# Patient Record
Sex: Male | Born: 1980 | Race: White | Hispanic: No | Marital: Married
Health system: Southern US, Community
[De-identification: ages and names within clinical notes are randomized; demographics above are authoritative.]

## PROBLEM LIST (undated history)

## (undated) DIAGNOSIS — J45909 Unspecified asthma, uncomplicated: Secondary | ICD-10-CM

---
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
## 2002-08-19 ENCOUNTER — Encounter: Payer: Self-pay | Admitting: Emergency Medicine

## 2002-08-19 ENCOUNTER — Emergency Department (HOSPITAL_COMMUNITY): Admission: EM | Admit: 2002-08-19 | Discharge: 2002-08-19 | Payer: Self-pay | Admitting: Emergency Medicine

## 2005-07-09 ENCOUNTER — Emergency Department (HOSPITAL_COMMUNITY): Admission: EM | Admit: 2005-07-09 | Discharge: 2005-07-09 | Payer: Self-pay | Admitting: Emergency Medicine

## 2005-08-09 ENCOUNTER — Emergency Department (HOSPITAL_COMMUNITY): Admission: EM | Admit: 2005-08-09 | Discharge: 2005-08-09 | Payer: Self-pay | Admitting: Family Medicine

## 2015-11-20 ENCOUNTER — Other Ambulatory Visit (HOSPITAL_COMMUNITY): Payer: Self-pay | Admitting: Physician Assistant

## 2015-11-20 DIAGNOSIS — R1011 Right upper quadrant pain: Secondary | ICD-10-CM

## 2015-11-20 DIAGNOSIS — R197 Diarrhea, unspecified: Secondary | ICD-10-CM

## 2015-11-27 ENCOUNTER — Encounter (HOSPITAL_COMMUNITY)
Admission: RE | Admit: 2015-11-27 | Discharge: 2015-11-27 | Disposition: A | Discharge status: Home / Self Care | Payer: Managed Care, Other (non HMO) | Source: Ambulatory Visit | Attending: Physician Assistant | Admitting: Physician Assistant

## 2015-11-27 DIAGNOSIS — R1011 Right upper quadrant pain: Secondary | ICD-10-CM | POA: Insufficient documentation

## 2015-11-27 DIAGNOSIS — R197 Diarrhea, unspecified: Secondary | ICD-10-CM | POA: Insufficient documentation

## 2015-11-27 MED ORDER — TECHNETIUM TC 99M MEBROFENIN IV KIT
5.0000 | PACK | Freq: Once | INTRAVENOUS | Status: AC | PRN
  Administered 2015-11-27: 5 via INTRAVENOUS

## 2015-12-02 ENCOUNTER — Encounter (HOSPITAL_COMMUNITY): Payer: Self-pay

## 2016-09-06 ENCOUNTER — Encounter (HOSPITAL_BASED_OUTPATIENT_CLINIC_OR_DEPARTMENT_OTHER): Payer: Self-pay | Admitting: Emergency Medicine

## 2016-09-06 ENCOUNTER — Emergency Department (HOSPITAL_BASED_OUTPATIENT_CLINIC_OR_DEPARTMENT_OTHER)
Admission: EM | Admit: 2016-09-06 | Discharge: 2016-09-06 | Disposition: A | Discharge status: Home / Self Care | Payer: Managed Care, Other (non HMO) | Attending: Emergency Medicine | Admitting: Emergency Medicine

## 2016-09-06 DIAGNOSIS — Z5321 Procedure and treatment not carried out due to patient leaving prior to being seen by health care provider: Secondary | ICD-10-CM | POA: Diagnosis not present

## 2016-09-06 DIAGNOSIS — R197 Diarrhea, unspecified: Secondary | ICD-10-CM | POA: Diagnosis not present

## 2016-09-06 HISTORY — DX: Unspecified asthma, uncomplicated: J45.909

## 2016-09-06 NOTE — ED Triage Notes (Signed)
PT present with c/o abdominal pain and diarrhea

## 2018-01-22 IMAGING — NM NM HEPATO W/GB/PHARM/[PERSON_NAME]
2 series · 12 of 12 positions shown · non-contrast
Comparison: None.

CLINICAL DATA: Right upper quadrant pain.  Diarrhea.

EXAM:
NUCLEAR MEDICINE HEPATOBILIARY IMAGING WITH GALLBLADDER EF
TECHNIQUE: Sequential images of the abdomen were obtained [DATE] minutes
following intravenous administration of radiopharmaceutical. After
oral ingestion of Ensure, gallbladder ejection fraction was
determined. At 60 min, normal ejection fraction is greater than 33%.
RADIOPHARMACEUTICALS:  5.2 mCi 1c-HHm  Choletec IV

[he hepatobiliary · 3.43mm/px · 6 of 60 frames shown (1 of 2)]
[frame 6/60]
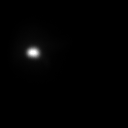
[frame 16/60]
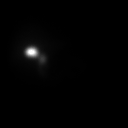
[frame 26/60]
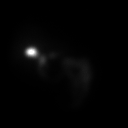
[frame 36/60]
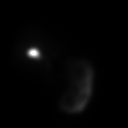
[frame 46/60]
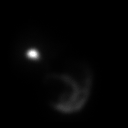
[frame 56/60]
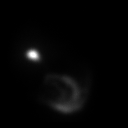

[he hepatobiliary · 3.43mm/px · 6 of 60 frames shown (2 of 2)]
[frame 6/60]
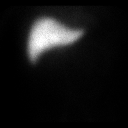
[frame 16/60]
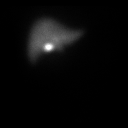
[frame 26/60]
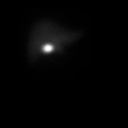
[frame 36/60]
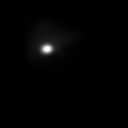
[frame 46/60]
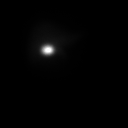
[frame 56/60]
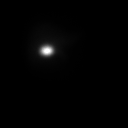

[12 of 12 positions shown; findings below may reference images not displayed]

FINDINGS: Prompt uptake and biliary excretion of activity by the liver is
seen. Gallbladder activity is visualized, consistent with patency of
cystic duct. Biliary activity passes into small bowel, consistent
with patent common bile duct.

Calculated gallbladder ejection fraction is 57%%. (Normal
gallbladder ejection fraction with Ensure is greater than 33%.)
IMPRESSION: 1. Normal hepatobiliary scan.
2. No biliary obstruction.
3. Normal gallbladder ejection fraction.

## 2019-10-23 ENCOUNTER — Encounter: Payer: Self-pay | Admitting: Neurology

## 2019-11-13 ENCOUNTER — Encounter: Payer: Self-pay | Admitting: Nurse Practitioner

## 2019-11-15 ENCOUNTER — Ambulatory Visit: Payer: Managed Care, Other (non HMO) | Admitting: Nurse Practitioner

## 2019-11-20 NOTE — Progress Notes (Deleted)
NEUROLOGY CONSULTATION NOTE  Saxton Chain MRN: [id number] DOB: [birth year]  Referring provider: Lindaann Pascal, PA-C Primary care provider: No PCP  Reason for consult:  headaches  HISTORY OF PRESENT ILLNESS: Frank Hodges is a 39 year old ***-handed male with ADHD who presents for headaches.  History supplemented by referring provider's note.  Onset:  *** Location:  *** Quality:  *** Intensity:  ***.  *** denies new headache, thunderclap headache or severe headache that wakes *** from sleep. Aura:  *** Prodrome:  *** Postdrome:  *** Associated symptoms:  ***.  *** denies associated unilateral numbness or weakness. Duration:  *** Frequency:  *** Frequency of abortive medication: *** Triggers:  *** Relieving factors:  *** Activity:  ***  Current NSAIDS/analgesics:  Hydrocodone-acteaminophen, ketorolac 10mg , indomethacin 50mg  Current triptans:  *** Current ergotamine:  *** Current anti-emetic:  *** Current muscle relaxants:  Flexeril 10mg  Current anti-anxiolytic:  *** Current sleep aide:  *** Current Antihypertensive medications:  *** Current Antidepressant medications:  Wellbutrin, Vyvanse Current Anticonvulsant medications:  *** Current anti-CGRP:  *** Current Vitamins/Herbal/Supplements:  *** Current Antihistamines/Decongestants:  *** Other therapy:  *** Hormone/birth control:  *** Other medications:  methylphenidate  Past NSAIDS:  *** Past analgesics:  *** Past abortive triptans:  *** Past abortive ergotamine:  *** Past muscle relaxants:  *** Past anti-emetic:  *** Past antihypertensive medications:  *** Past antidepressant medications:  *** Past anticonvulsant medications:  *** Past anti-CGRP:  *** Past vitamins/Herbal/Supplements:  *** Past antihistamines/decongestants:  *** Other past therapies:  ***  Caffeine:  *** Alcohol:  *** Smoker:  *** Diet:  *** Exercise:  *** Depression:  ***; Anxiety:  *** Other pain:  *** Sleep hygiene:   *** Family history of headache:  ***  10/04/2019 LABS:  CBC with WBC 7.3, HGB 14.7, H CT 43.9, PLT 245; CMP with Na 140, K 5, Cl 104, CO2 24, Ca 9.6, t bili 0.5, ALP 34, AST 17, ALT 20.   PAST MEDICAL HISTORY: Past Medical History:  Diagnosis Date  . Asthma     PAST SURGICAL HISTORY: ***  MEDICATIONS: ***  ALLERGIES: ***  FAMILY HISTORY: ***.  SOCIAL HISTORY: Social History   Socioeconomic History  . Marital status: Married    Spouse name: Not on file  . Number of children: Not on file  . Years of education: Not on file  . Highest education level: Not on file  Occupational History  . Not on file  Tobacco Use  . Smoking status: Not on file  Substance and Sexual Activity  . Alcohol use: Not on file  . Drug use: Not on file  . Sexual activity: Not on file  Other Topics Concern  . Not on file  Social History Narrative  . Not on file   Social Determinants of Health   Financial Resource Strain:   . Difficulty of Paying Living Expenses: Not on file  Food Insecurity:   . Worried About in the Last Year: Not on file  . Ran Out of Food in the Last Year: Not on file  Transportation Needs:   . Lack of Transportation (Medical): Not on file  . Lack of Transportation (Non-Medical): Not on file  Physical Activity:   . Days of Exercise per Week: Not on file  . Minutes of Exercise per Session: Not on file  Stress:   . Feeling of Stress : Not on file  Social Connections:   . Frequency of Communication with Friends and Family: Not  on file  . Frequency of Social Gatherings with Friends and Family: Not on file  . Attends Religious Services: Not on file  . Active Member of Clubs or Organizations: Not on file  . Attends Banker Meetings: Not on file  . Marital Status: Not on file  Intimate Partner Violence:   . Fear of Current or Ex-Partner: Not on file  . Emotionally Abused: Not on file  . Physically Abused: Not on file  . Sexually  Abused: Not on file    PHYSICAL EXAM: *** General: No acute distress.  Patient appears well-groomed.  Head:  Normocephalic/atraumatic Eyes:  fundi examined but not visualized Neck: supple, no paraspinal tenderness, full range of motion Back: No paraspinal tenderness Heart: regular rate and rhythm Lungs: Clear to auscultation bilaterally. Vascular: No carotid bruits. Neurological Exam: Mental status: alert and oriented to person, place, and time, recent and remote memory intact, fund of knowledge intact, attention and concentration intact, speech fluent and not dysarthric, language intact. Cranial nerves: CN I: not tested CN II: pupils equal, round and reactive to light, visual fields intact CN III, IV, VI:  full range of motion, no nystagmus, no ptosis CN V: facial sensation intact CN VII: upper and lower face symmetric CN VIII: hearing intact CN IX, X: gag intact, uvula midline CN XI: sternocleidomastoid and trapezius muscles intact CN XII: tongue midline Bulk & Tone: normal, no fasciculations. Motor:  5/5 throughout  Sensation:  Pinprick and vibration sensation intact. Deep Tendon Reflexes:  2+ throughout, toes downgoing.  Finger to nose testing:  Without dysmetria.  Heel to shin:  Without dysmetria.  Gait:  Normal station and stride.  Able to turn and tandem walk. Romberg negative.  IMPRESSION: ***  PLAN: ***  Thank you for allowing me to take part in the care of this patient.  Shon Millet, DO  CC: ***

## 2019-11-22 ENCOUNTER — Ambulatory Visit: Payer: Managed Care, Other (non HMO) | Admitting: Neurology
